# Patient Record
Sex: Female | Born: 1965 | Hispanic: No | Marital: Single | Smoking: Never smoker
Health system: Southern US, Community
[De-identification: ages and names within clinical notes are randomized; demographics above are authoritative.]

## PROBLEM LIST (undated history)

## (undated) DIAGNOSIS — D509 Iron deficiency anemia, unspecified: Secondary | ICD-10-CM

## (undated) DIAGNOSIS — M797 Fibromyalgia: Secondary | ICD-10-CM

## (undated) DIAGNOSIS — R51 Headache: Secondary | ICD-10-CM

## (undated) DIAGNOSIS — F329 Major depressive disorder, single episode, unspecified: Secondary | ICD-10-CM

## (undated) DIAGNOSIS — J45909 Unspecified asthma, uncomplicated: Secondary | ICD-10-CM

## (undated) DIAGNOSIS — F32A Depression, unspecified: Secondary | ICD-10-CM

## (undated) DIAGNOSIS — I341 Nonrheumatic mitral (valve) prolapse: Secondary | ICD-10-CM

## (undated) DIAGNOSIS — M5416 Radiculopathy, lumbar region: Secondary | ICD-10-CM

## (undated) DIAGNOSIS — F419 Anxiety disorder, unspecified: Secondary | ICD-10-CM

## (undated) DIAGNOSIS — L209 Atopic dermatitis, unspecified: Secondary | ICD-10-CM

## (undated) HISTORY — DX: Atopic dermatitis, unspecified: L20.9

## (undated) HISTORY — DX: Major depressive disorder, single episode, unspecified: F32.9

## (undated) HISTORY — DX: Unspecified asthma, uncomplicated: J45.909

## (undated) HISTORY — DX: Iron deficiency anemia, unspecified: D50.9

## (undated) HISTORY — DX: Fibromyalgia: M79.7

## (undated) HISTORY — DX: Anxiety disorder, unspecified: F41.9

## (undated) HISTORY — DX: Nonrheumatic mitral (valve) prolapse: I34.1

## (undated) HISTORY — DX: Depression, unspecified: F32.A

## (undated) HISTORY — DX: Headache: R51

---

## 2012-02-29 ENCOUNTER — Encounter: Payer: Self-pay | Admitting: Internal Medicine

## 2012-03-01 ENCOUNTER — Encounter: Payer: Self-pay | Admitting: Pulmonary Disease

## 2012-03-01 ENCOUNTER — Encounter: Payer: Self-pay | Admitting: Internal Medicine

## 2012-03-01 ENCOUNTER — Ambulatory Visit (INDEPENDENT_AMBULATORY_CARE_PROVIDER_SITE_OTHER): Payer: Medicaid Other | Admitting: Internal Medicine

## 2012-03-01 DIAGNOSIS — J45909 Unspecified asthma, uncomplicated: Secondary | ICD-10-CM | POA: Insufficient documentation

## 2012-03-01 MED ORDER — OMEPRAZOLE 40 MG PO CPDR: 40.0000 mg | DELAYED_RELEASE_CAPSULE | Freq: Every day | ORAL | Status: AC

## 2012-03-01 MED ORDER — FAMOTIDINE 20 MG PO TABS: ORAL_TABLET | ORAL | Status: AC

## 2012-03-01 NOTE — Assessment & Plan Note (Addendum)
-   hfa 75% 03/01/2012    - Spirometry nl x for exp truncation 03/01/2012    - Sinus CT ordered 03/01/2012 >>>  Symptoms are markedly disproportionate to objective findings and not clear this is a lung problem but pt does appear to have difficult airway management issues. DDX of  difficult airways managment all start with A and  include Adherence, Ace Inhibitors, Acid Reflux, Active Sinus Disease, Alpha 1 Antitripsin deficiency, Anxiety masquerading as Airways dz,  ABPA,  allergy(esp in young), Aspiration (esp in elderly), Adverse effects of DPI,  Active smokers, plus two Bs  = Bronchiectasis and Beta blocker use..and one C= CHF  Adherence is always the initial "prime suspect" and is a multilayered concern that requires a "trust but verify" approach in every patient - starting with knowing how to use medications, especially inhalers, correctly, keeping up with refills and understanding the fundamental difference between maintenance and prns vs those medications only taken for a very short course and then stopped and not refilled. The proper method of use, as well as anticipated side effects, of a metered-dose inhaler are discussed and demonstrated to the patient. Improved effectiveness after extensive coaching during this visit to a level of approximately  75%  ? Adverse effect of DPI > try off spiriva as this is not copd  ? Acid reflux > rx  ? Active sinus dz > check sinus ct  Spirometry strongly points to  Classic Upper airway cough syndrome, so named because it's frequently impossible to sort out how much is  CR/sinusitis with freq throat clearing (which can be related to primary GERD)   vs  causing  secondary (" extra esophageal")  GERD from wide swings in gastric pressure that occur with throat clearing, often  promoting self use of mint and menthol lozenges that reduce the lower esophageal sphincter tone and exacerbate the problem further in a cyclical fashion.   These are the same pts (now being  labeled as having "irritable larynx syndrome" by some cough centers) who not infrequently have a history of having failed to tolerate ace inhibitors,  dry powder inhalers or biphosphonates or report having atypical reflux symptoms that don't respond to standard doses of PPI , and are easily confused as having aecopd or asthma flares by even experienced allergists/ pulmonologists.   For now explore sinus dz/ rx reflux then return in 4 weeks for formal pft's  See instructions for specific recommendations which were reviewed directly with the patient who was given a copy with highlighter outlining the key components.

## 2012-03-01 NOTE — Progress Notes (Signed)
Subjective:     Patient ID: Mary Shelton, female   DOB: 1966/09/17, 46 y.o.   MRN: [id number]  HPI  89 yobf never smoker never trouble until 2009 when exposed to sewer fumes eval in ER [place] then eval [facility] now followed in [place] [facility] with refractory "bronchitis"  03/01/2012 1st pulmonary eval / Mary Shelton cc 24 h per day persistent cough assoc with sensation of too much throat mucus with variable production of thick grey mucus and sob with minimal acitivity, not better with saba, spiriva or qvar, assoc with intermittent hb but no overt sinus complaints. No exposure since original event.    Also denies any obvious fluctuation of symptoms with weather or environmental changes or other aggravating or alleviating factors except as outlined above.  ROS  At present neg for  any significant sore throat, dysphagia, dental problems, itching, sneezing,  nasal congestion or excess/ purulent secretions, ear ache,   fever, chills, sweats, unintended wt loss, pleuritic or exertional cp, hemoptysis, palpitations, orthopnea pnd or leg swelling.  Also denies presyncope, palpitations, heartburn, abdominal pain, anorexia, nausea, vomiting, diarrhea  or change in bowel or urinary habits, change in stools or urine, dysuria,hematuria,  rash, arthralgias, visual complaints, headache, numbness weakness or ataxia or problems with walking or coordination. No noted change in mood/affect or memory.                    Review of Systems     Objective:   Physical Exam amb bf   failed to answer a single question asked in a straightforward manner, tending to go off on tangents or answer questions with ambiguous medical terms or diagnoses and seemed perplexed  when asked the same question more than once for clarification entirely focused on the original event   HEENT: nl dentition, turbinates, and orophanx. Nl external ear canals without cough reflex   NECK :  without JVD/Nodes/TM/ nl carotid upstrokes  bilaterally   LUNGS: no acc muscle use, clear to A and P bilaterally without cough on insp or exp maneuvers   CV:  RRR  no s3 or murmur or increase in P2, no edema   ABD:  soft and nontender with nl excursion in the supine position. No bruits or organomegaly, bowel sounds nl  MS:  warm without deformities, calf tenderness, cyanosis or clubbing  SKIN: warm and dry without lesions    NEURO:  alert, approp, no deficits       Assessment:         Plan:

## 2012-03-01 NOTE — Patient Instructions (Addendum)
Stop spiriva to see what difference it makes   Qvar 80 Take 2 puffs first thing in am and then another 2 puffs about 12 hours later  Pepcid 20 mg one at bedtime  Mucinex dm 2 every 12 hours as needed for cough  GERD (REFLUX)  is an extremely common cause of respiratory symptoms, many times with no significant heartburn at all.    It can be treated with medication, but also with lifestyle changes including avoidance of late meals, excessive alcohol, smoking cessation, and avoid fatty foods, chocolate, peppermint, colas, red wine, and acidic juices such as orange juice.  NO MINT OR MENTHOL PRODUCTS SO NO COUGH DROPS  USE SUGARLESS CANDY INSTEAD (jolley ranchers or Stover's)  NO OIL BASED VITAMINS - use powdered substitutes.  Please schedule a follow up office visit in 4 weeks, sooner if needed with PFt's

## 2012-03-03 ENCOUNTER — Other Ambulatory Visit: Payer: Medicaid Other

## 2012-03-07 ENCOUNTER — Encounter: Payer: Self-pay | Admitting: Internal Medicine

## 2012-04-07 ENCOUNTER — Telehealth: Payer: Self-pay | Admitting: Internal Medicine

## 2012-04-07 NOTE — Telephone Encounter (Signed)
Phone call to pt, pt will not be attending appt on Monday April 10, 2012.  Mary Shelton, C.MA

## 2012-04-10 ENCOUNTER — Ambulatory Visit: Payer: Medicaid Other | Admitting: Internal Medicine

## 2017-10-20 ENCOUNTER — Encounter: Payer: Self-pay | Admitting: Physical Medicine & Rehabilitation

## 2017-10-30 ENCOUNTER — Encounter (HOSPITAL_COMMUNITY): Payer: Self-pay | Admitting: *Deleted

## 2017-10-30 ENCOUNTER — Other Ambulatory Visit: Payer: Self-pay

## 2017-10-30 ENCOUNTER — Emergency Department (HOSPITAL_COMMUNITY)
Admission: EM | Admit: 2017-10-30 | Discharge: 2017-10-30 | Disposition: A | Discharge status: Home / Self Care | Payer: Medicaid Other | Attending: Emergency Medicine | Admitting: Emergency Medicine

## 2017-10-30 DIAGNOSIS — M545 Low back pain, unspecified: Secondary | ICD-10-CM

## 2017-10-30 DIAGNOSIS — Z79899 Other long term (current) drug therapy: Secondary | ICD-10-CM | POA: Insufficient documentation

## 2017-10-30 DIAGNOSIS — Z882 Allergy status to sulfonamides status: Secondary | ICD-10-CM | POA: Insufficient documentation

## 2017-10-30 DIAGNOSIS — J45909 Unspecified asthma, uncomplicated: Secondary | ICD-10-CM | POA: Diagnosis not present

## 2017-10-30 DIAGNOSIS — G8929 Other chronic pain: Secondary | ICD-10-CM | POA: Insufficient documentation

## 2017-10-30 DIAGNOSIS — R531 Weakness: Secondary | ICD-10-CM | POA: Diagnosis not present
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
## 2017-10-30 NOTE — Discharge Instructions (Signed)
Please read attached information. If you experience any new or worsening signs or symptoms please return to the emergency room for evaluation. Please follow-up with your primary care provider or specialist as discussed.  °

## 2017-10-30 NOTE — ED Triage Notes (Signed)
Pt reports hx of chronic back pain due to bulging disc. occ gets left leg weakness which has recently began again. Is ambulatory at triage. Reports MRI in past.

## 2017-10-30 NOTE — ED Notes (Signed)
Pt states she understaNDS INSTRUC TIONS. HOME STABLE WITH DAUGHTER.

## 2017-11-03 ENCOUNTER — Encounter (HOSPITAL_COMMUNITY): Payer: Self-pay | Admitting: Emergency Medicine

## 2017-11-03 ENCOUNTER — Ambulatory Visit (HOSPITAL_COMMUNITY)
Admission: EM | Admit: 2017-11-03 | Discharge: 2017-11-03 | Disposition: A | Discharge status: Home / Self Care | Payer: Medicaid Other | Attending: Family Medicine | Admitting: Family Medicine

## 2017-11-03 DIAGNOSIS — M545 Low back pain, unspecified: Secondary | ICD-10-CM

## 2017-11-03 DIAGNOSIS — G8929 Other chronic pain: Secondary | ICD-10-CM | POA: Diagnosis not present

## 2017-11-03 MED ORDER — KETOROLAC TROMETHAMINE 60 MG/2ML IM SOLN
INTRAMUSCULAR | Status: AC
  Filled 2017-11-03: qty 2

## 2017-11-03 MED ORDER — TRAMADOL HCL 50 MG PO TABS: 50.0000 mg | ORAL_TABLET | Freq: Four times a day (QID) | ORAL | 0 refills | Status: DC | PRN

## 2017-11-03 MED ORDER — KETOROLAC TROMETHAMINE 60 MG/2ML IM SOLN
60.0000 mg | Freq: Once | INTRAMUSCULAR | Status: AC
  Administered 2017-11-03: 60 mg via INTRAMUSCULAR

## 2017-11-03 NOTE — Discharge Instructions (Signed)
We gave you an injection of Toradol today.   I have sent a prescription for Tramadol for you- this is a weaker opioid and should not cause as severe sedation as hydrocodone/oxycodone, but will still cause some.   Please set up and appointment with community [place] and wellness (info below) to establish a primary provider and get referral to neurosurgery. I have provided the info below for [facility] who does have a spine doctor- reach out and see if they do injections/if you need referal. May also try [facility].

## 2017-11-03 NOTE — ED Triage Notes (Signed)
PT reports back pain and shooting left leg pain. PT reports cough exacerbates back pain. PT has had cough for a few weeks. PT reports left leg pain has been present for several 2 years
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
## 2018-01-03 ENCOUNTER — Ambulatory Visit (HOSPITAL_COMMUNITY)
Admission: EM | Admit: 2018-01-03 | Discharge: 2018-01-03 | Disposition: A | Discharge status: Home / Self Care | Payer: Medicaid Other | Attending: Family Medicine | Admitting: Family Medicine

## 2018-01-03 ENCOUNTER — Encounter (HOSPITAL_COMMUNITY): Payer: Self-pay | Admitting: Emergency Medicine

## 2018-01-03 DIAGNOSIS — J4521 Mild intermittent asthma with (acute) exacerbation: Secondary | ICD-10-CM

## 2018-01-03 DIAGNOSIS — J069 Acute upper respiratory infection, unspecified: Secondary | ICD-10-CM | POA: Diagnosis not present

## 2018-01-03 MED ORDER — ONDANSETRON 4 MG PO TBDP: 4.0000 mg | ORAL_TABLET | Freq: Three times a day (TID) | ORAL | 0 refills | Status: DC | PRN

## 2018-01-03 MED ORDER — ALBUTEROL SULFATE (2.5 MG/3ML) 0.083% IN NEBU: 2.5000 mg | INHALATION_SOLUTION | Freq: Four times a day (QID) | RESPIRATORY_TRACT | 1 refills | Status: DC | PRN

## 2018-01-03 MED ORDER — PSEUDOEPH-BROMPHEN-DM 30-2-10 MG/5ML PO SYRP: 5.0000 mL | ORAL_SOLUTION | Freq: Four times a day (QID) | ORAL | 0 refills | Status: AC | PRN

## 2018-01-03 MED ORDER — PREDNISONE 50 MG PO TABS: 50.0000 mg | ORAL_TABLET | Freq: Every day | ORAL | 0 refills | Status: AC

## 2018-01-03 MED ORDER — ALBUTEROL SULFATE HFA 108 (90 BASE) MCG/ACT IN AERS: 2.0000 | INHALATION_SPRAY | Freq: Four times a day (QID) | RESPIRATORY_TRACT | 1 refills | Status: DC | PRN

## 2018-01-03 NOTE — Discharge Instructions (Addendum)
Please use your inhaler and nebulizers as needed for wheezing and shortness of breath.  I have refilled both of these for you.  Please also begin prednisone daily for the next 5 days.  This should help with your wheezing and asthma exacerbation.  Please continue symptom management for your other symptoms.  If you begin to have fever or worsening symptoms please return.  For congestion please begin daily allergy pill like Zyrtec or Claritin, may also use Flonase nasal spray.  He may get these over-the-counter.  For cough I have sent in a cough syrup that also has a decongestant in it.  You may also try honey mixed in hot tea.

## 2018-01-03 NOTE — ED Provider Notes (Addendum)
[facility]    CSN: [id number] Arrival date & time: 01/03/18  1000     History   Chief Complaint Chief Complaint  Patient presents with  . Asthma    HPI Mary Shelton is a 52 y.o. female history of asthma, Patient is presenting with URI symptoms- congestion, cough, minimal sore throat.  Also noting she was having high fevers up to 103 this weekend as well as some nausea.  Patient's main complaints are concerned for asthma exacerbation, patient has been wheezing more and is out of albuterol inhaler.  She also uses nebulizers at home.. Symptoms have been going on for 3-4 days. Patient has tried over-the-counter cough medicine, with minimal relief. Denies vomiting, diarrhea. Denies chest pain.    HPI  Past Medical History:  Diagnosis Date  . Anxiety   . Asthma   . Atopic dermatitis   . Depression   . Fibromyalgia   . Headache(784.0)   . Iron deficiency anemia   . Mitral valve prolapse   . Reactive airway disease with wheezing     Patient Active Problem List   Diagnosis Date Noted  . Asthma 03/01/2012    History reviewed. No pertinent surgical history.  OB History   None      Home Medications    Prior to Admission medications   Medication Sig Start Date End Date Taking? Authorizing Provider  albuterol (PROVENTIL HFA;VENTOLIN HFA) 108 (90 Base) MCG/ACT inhaler Inhale 2 puffs into the lungs every 6 (six) hours as needed. 01/03/18   Twilla Khouri C, PA-C  albuterol (PROVENTIL) (2.5 MG/3ML) 0.083% nebulizer solution Take 3 mLs (2.5 mg total) by nebulization every 6 (six) hours as needed for wheezing or shortness of breath. 01/03/18   Caidence Kaseman C, PA-C  beclomethasone (QVAR) 80 MCG/ACT inhaler Inhale 2 puffs into the lungs 2 (two) times daily.     [provider]  brompheniramine-pseudoephedrine-DM 30-2-10 MG/5ML syrup Take 5 mLs by mouth 4 (four) times daily as needed. 01/03/18   Verneda Hollopeter C, PA-C  famotidine (PEPCID) 20 MG tablet One at  bedtime 03/01/12 03/01/13  Nyoka Cowden, MD  omeprazole (PRILOSEC) 40 MG capsule Take 1 capsule (40 mg total) by mouth daily. 03/01/12 03/01/13  Nyoka Cowden, MD  predniSONE (DELTASONE) 50 MG tablet Take 1 tablet (50 mg total) by mouth daily for 5 days. 01/03/18 01/08/18  Leathie Weich C, PA-C  traMADol (ULTRAM) 50 MG tablet Take 1 tablet (50 mg total) by mouth every 6 (six) hours as needed for severe pain. 11/03/17   Clarine Elrod C, PA-C  triamcinolone lotion (KENALOG) 0.1 % Apply 1 application topically 3 (three) times daily.    [provider]    Family History History reviewed. No pertinent family history.  Social History Social History   Tobacco Use  . Smoking status: Never Smoker  . Smokeless tobacco: Never Used  Substance Use Topics  . Alcohol use: No  . Drug use: No     Allergies   Other and Sulfa antibiotics   Review of Systems Review of Systems  Constitutional: Positive for fever. Negative for chills and fatigue.  HENT: Positive for congestion and rhinorrhea. Negative for ear pain, sinus pressure, sore throat and trouble swallowing.   Respiratory: Positive for cough, shortness of breath and wheezing. Negative for chest tightness.   Cardiovascular: Negative for chest pain.  Gastrointestinal: Positive for nausea. Negative for abdominal pain and vomiting.  Musculoskeletal: Negative for myalgias.  Skin: Negative for rash.  Neurological:  Negative for dizziness, light-headedness and headaches.     Physical Exam Triage Vital Signs ED Triage Vitals [01/03/18 1034]  Enc Vitals Group     BP 111/81     Pulse Rate (!) 102     Resp 18     Temp 98.8 F (37.1 C)     Temp Source Oral     SpO2 99 %     Weight      Height      Head Circumference      Peak Flow      Pain Score      Pain Loc      Pain Edu?      Excl. in GC?    No data found.  Updated Vital Signs BP 111/81 (BP Location: Right Arm)   Pulse (!) 102   Temp 98.8 F (37.1 C) (Oral)    Resp 18   SpO2 99%   Visual Acuity Right Eye Distance:   Left Eye Distance:   Bilateral Distance:    Right Eye Near:   Left Eye Near:    Bilateral Near:     Physical Exam  Constitutional: She appears well-developed and well-nourished. No distress.  HENT:  Head: Normocephalic and atraumatic.  Bilateral TMs minimally visualized due to cerumen.  Nasal mucosa mildly erythematous with clear rhinorrhea present.  Posterior oropharynx nonerythematous, no tonsillar enlargement or exudate.  Eyes: Conjunctivae are normal.  Neck: Neck supple.  No cervical lymphadenopathy  Cardiovascular: Normal rate and regular rhythm.  No murmur heard. Pulmonary/Chest: Effort normal and breath sounds normal. No respiratory distress.  Breathing comfortably, diffuse wheezing and rhonchi throughout bilateral lung fields  Abdominal: Soft. There is no tenderness.  Musculoskeletal: She exhibits no edema.  Neurological: She is alert.  Skin: Skin is warm and dry.  Psychiatric: She has a normal mood and affect.  Nursing note and vitals reviewed.    UC Treatments / Results  Labs (all labs ordered are listed, but only abnormal results are displayed) Labs Reviewed - No data to display  EKG None Radiology No results found.  Procedures Procedures (including critical care time)  Medications Ordered in UC Medications - No data to display   Initial Impression / Assessment and Plan / UC Course  I have reviewed the triage vital signs and the nursing notes.  Pertinent labs & imaging results that were available during my care of the patient were reviewed by me and considered in my medical decision making (see chart for details).     Patient likely with viral URI and asthma exacerbation.  Patient having high fevers a few days ago, possible flu.  Vital signs stable at this time.  Will refill albuterol inhaler nebulizers to use as needed for shortness of breath and wheezing.  Prednisone 50 mg x 5 days.  Offered  breathing treatment here in clinic today, patient declined.  Cough syrup provided.  Advised over-the-counter measures to control congestion. Discussed strict return precautions. Patient verbalized understanding and is agreeable with plan.   Final Clinical Impressions(s) / UC Diagnoses   Final diagnoses:  Mild intermittent asthma with exacerbation  Upper respiratory tract infection, unspecified type    ED Discharge Orders        Ordered    albuterol (PROVENTIL HFA;VENTOLIN HFA) 108 (90 Base) MCG/ACT inhaler  Every 6 hours PRN     01/03/18 1050    albuterol (PROVENTIL) (2.5 MG/3ML) 0.083% nebulizer solution  Every 6 hours PRN     01/03/18 1050  predniSONE (DELTASONE) 50 MG tablet  Daily     01/03/18 1050    brompheniramine-pseudoephedrine-DM 30-2-10 MG/5ML syrup  4 times daily PRN     01/03/18 1050       Controlled Substance Prescriptions [place] Controlled Substance Registry consulted? Not Applicable   Lew Dawes, PA-C 01/03/18 1110    Syrena Burges, [place], [place] 01/03/18 1110

## 2018-01-03 NOTE — ED Triage Notes (Signed)
Pt sts URI sx and out of asthma meds

## 2018-01-30 ENCOUNTER — Ambulatory Visit (HOSPITAL_COMMUNITY)
Admission: EM | Admit: 2018-01-30 | Discharge: 2018-01-30 | Disposition: A | Discharge status: Home / Self Care | Payer: Medicaid Other | Attending: Family Medicine | Admitting: Family Medicine

## 2018-01-30 ENCOUNTER — Other Ambulatory Visit: Payer: Self-pay

## 2018-01-30 ENCOUNTER — Encounter (HOSPITAL_COMMUNITY): Payer: Self-pay

## 2018-01-30 DIAGNOSIS — F410 Panic disorder [episodic paroxysmal anxiety] without agoraphobia: Secondary | ICD-10-CM

## 2018-01-30 DIAGNOSIS — F4310 Post-traumatic stress disorder, unspecified: Secondary | ICD-10-CM | POA: Diagnosis not present

## 2018-01-30 DIAGNOSIS — F430 Acute stress reaction: Secondary | ICD-10-CM | POA: Diagnosis not present

## 2018-01-30 DIAGNOSIS — F411 Generalized anxiety disorder: Secondary | ICD-10-CM

## 2018-01-30 MED ORDER — HYDROXYZINE HCL 25 MG PO TABS: 25.0000 mg | ORAL_TABLET | Freq: Four times a day (QID) | ORAL | 0 refills | Status: AC | PRN

## 2018-01-30 NOTE — ED Triage Notes (Signed)
Pt presents with complaints of having an anxiety attack today and feeling like her whole body is vibrating.

## 2018-01-30 NOTE — ED Provider Notes (Signed)
[facility]    CSN: [id number] Arrival date & time: 01/30/18  1217     History   Chief Complaint Chief Complaint  Patient presents with  . Panic Attack    HPI Mary Shelton is a 52 y.o. female.   Patient complains of panic attack.  It began today after she had a confrontation with her real-estate agent regarding moving into a townhome.  Patient states she has a stressful home life and PTSD. She describes it whole body "buzzing."  She has not tried anything without relief.  She denies aggravating symptoms.  She reports similar symptoms in the past that improved with clonidine and wellbutrin with relief.       Past Medical History:  Diagnosis Date  . Anxiety   . Asthma   . Atopic dermatitis   . Depression   . Fibromyalgia   . Headache(784.0)   . Iron deficiency anemia   . Mitral valve prolapse   . Reactive airway disease with wheezing     Patient Active Problem List   Diagnosis Date Noted  . Asthma 03/01/2012    History reviewed. No pertinent surgical history.  OB History   None      Home Medications    Prior to Admission medications   Medication Sig Start Date End Date Taking? Authorizing Provider  albuterol (PROVENTIL HFA;VENTOLIN HFA) 108 (90 Base) MCG/ACT inhaler Inhale 2 puffs into the lungs every 6 (six) hours as needed. 01/03/18   Wieters, Hallie C, PA-C  albuterol (PROVENTIL) (2.5 MG/3ML) 0.083% nebulizer solution Take 3 mLs (2.5 mg total) by nebulization every 6 (six) hours as needed for wheezing or shortness of breath. 01/03/18   Wieters, Hallie C, PA-C  beclomethasone (QVAR) 80 MCG/ACT inhaler Inhale 2 puffs into the lungs 2 (two) times daily.     [provider]  brompheniramine-pseudoephedrine-DM 30-2-10 MG/5ML syrup Take 5 mLs by mouth 4 (four) times daily as needed. 01/03/18   Wieters, Hallie C, PA-C  famotidine (PEPCID) 20 MG tablet One at bedtime 03/01/12 03/01/13  Nyoka CowdenWert, Michael B, MD  hydrOXYzine (ATARAX/VISTARIL) 25 MG tablet  Take 1 tablet (25 mg total) by mouth every 6 (six) hours as needed for up to 5 days for anxiety. 01/30/18 02/04/18  Antwoine Zorn, Lowanda FosterBrittany, PA-C  omeprazole (PRILOSEC) 40 MG capsule Take 1 capsule (40 mg total) by mouth daily. 03/01/12 03/01/13  Nyoka CowdenWert, Michael B, MD  ondansetron (ZOFRAN ODT) 4 MG disintegrating tablet Take 1 tablet (4 mg total) by mouth every 8 (eight) hours as needed for nausea or vomiting. 01/03/18   Wieters, Hallie C, PA-C  traMADol (ULTRAM) 50 MG tablet Take 1 tablet (50 mg total) by mouth every 6 (six) hours as needed for severe pain. 11/03/17   Wieters, Hallie C, PA-C  triamcinolone lotion (KENALOG) 0.1 % Apply 1 application topically 3 (three) times daily.    [provider]    Family History History reviewed. No pertinent family history.  Social History Social History   Tobacco Use  . Smoking status: Never Smoker  . Smokeless tobacco: Never Used  Substance Use Topics  . Alcohol use: No  . Drug use: No     Allergies   Other and Sulfa antibiotics   Review of Systems Review of Systems  Constitutional: Negative for chills and fever.  Respiratory: Negative for shortness of breath.   Cardiovascular: Negative for chest pain.  Gastrointestinal: Negative for abdominal pain, constipation, diarrhea, nausea and vomiting.  Genitourinary: Negative for dysuria.  Psychiatric/Behavioral: The patient  is nervous/anxious.      Physical Exam Triage Vital Signs ED Triage Vitals  Enc Vitals Group     BP 01/30/18 1317 (!) 154/96     Pulse Rate 01/30/18 1317 98     Resp 01/30/18 1317 18     Temp 01/30/18 1317 98.1 F (36.7 C)     Temp src --      SpO2 01/30/18 1317 100 %     Weight 01/30/18 1318 145 lb (65.8 kg)     Height --      Head Circumference --      Peak Flow --      Pain Score 01/30/18 1317 7     Pain Loc --      Pain Edu? --      Excl. in GC? --    No data found.  Updated Vital Signs BP (!) 154/96   Pulse 98   Temp 98.1 F (36.7 C)   Resp 18   Wt  145 lb (65.8 kg)   SpO2 100%   BMI 24.13 kg/m      Physical Exam  Constitutional: She is oriented to person, place, and time. She appears well-developed and well-nourished. No distress.  HENT:  Head: Normocephalic and atraumatic.  Right Ear: External ear normal.  Left Ear: External ear normal.  Nose: Nose normal.  Mouth/Throat: Oropharynx is clear and moist. No oropharyngeal exudate.  Eyes: Pupils are equal, round, and reactive to light. Conjunctivae and EOM are normal.  Neck: Normal range of motion. Neck supple.  Cardiovascular: Normal rate, regular rhythm and normal heart sounds. Exam reveals no gallop and no friction rub.  No murmur heard. Pulmonary/Chest: Effort normal and breath sounds normal. No respiratory distress. She has no wheezes. She has no rales.  Abdominal: Soft. Bowel sounds are normal. There is no tenderness. There is no guarding.  Lymphadenopathy:    She has no cervical adenopathy.  Neurological: She is alert and oriented to person, place, and time.  Skin: Skin is warm and dry. Capillary refill takes 2 to 3 seconds. She is not diaphoretic.  Psychiatric:  Sitting slightly slouched over on exam table without obvious signs of fidgeting or tremor.  Appearance .Hygiene: clean without noticeable body odor .Dress: Dressed for warm weather with sweater and stocking hat although it is spring time .Jewelry: No unusual rings or jewelry  .Makeup: None .Other: tattoo left forearm  Speech .General: Clear without accent .Rate: fast  .Latency WNL .Volume: Normal .Intonations: Normal  Behavior .General: increased activity mild restlessness.  Shifting on exam table .Eye Contact: Normal .Slight slouched over on exam table, but pt admits to chronic back pain  Cooperativeness .Cooperative  Thought Processes .Logical, but moves fast between ideas  Thought Content .Future oriented.  States she needs to follow up with PCP for further evaluation and treatment of  anxiety, stress, and PTSD  Mood Stressed and as if whole body is "buzzing."  Affect .Anxious and irritable.  Appropriate given circumstance of current situation.   .Type: Anxious .Appropriateness to content and congruence with stated mood  Cognition  Insight/Judgment .Good.  Patient is aware or her mental health and states she needs to follow up with PCP for further evaluation and treatment   Nursing note and vitals reviewed.    UC Treatments / Results  Labs (all labs ordered are listed, but only abnormal results are displayed) Labs Reviewed - No data to display  EKG None Radiology No results found.  Procedures Procedures (including critical  care time)  Medications Ordered in UC Medications - No data to display   Initial Impression / Assessment and Plan / UC Course  I have reviewed the triage vital signs and the nursing notes.  Pertinent labs & imaging results that were available during my care of the patient were reviewed by me and considered in my medical decision making (see chart for details).    Patient presents with symptoms of panic attack.  Symptoms and history consistent with panic attack.  Hydroxyzine prescribed.  Patient instructed to follow up with PCP this week for further evaluation and treatment.  Return and ER precautions given.   Final Clinical Impressions(s) / UC Diagnoses   Final diagnoses:  Panic attack as reaction to stress  Generalized anxiety disorder  PTSD (post-traumatic stress disorder)    ED Discharge Orders        Ordered    hydrOXYzine (ATARAX/VISTARIL) 25 MG tablet  Every 6 hours PRN     01/30/18 1359       Controlled Substance Prescriptions [place] Controlled Substance Registry consulted? No   Rennis Harding, [place] 01/30/18 1420

## 2018-01-30 NOTE — Discharge Instructions (Addendum)
Take medication as prescribed Follow up with PCP on Wednesday for further evaluation and treatment Return here or go to ER if you have any new or worsening symptoms

## 2018-02-03 ENCOUNTER — Telehealth (HOSPITAL_COMMUNITY): Payer: Self-pay | Admitting: Emergency Medicine

## 2018-02-04 NOTE — Telephone Encounter (Signed)
Entered in error

## 2018-05-23 ENCOUNTER — Other Ambulatory Visit: Payer: Self-pay | Admitting: Anesthesiology

## 2018-05-23 DIAGNOSIS — M5417 Radiculopathy, lumbosacral region: Secondary | ICD-10-CM

## 2018-05-23 DIAGNOSIS — G894 Chronic pain syndrome: Secondary | ICD-10-CM

## 2018-05-28 ENCOUNTER — Inpatient Hospital Stay
Admission: RE | Admit: 2018-05-28 | Discharge: 2018-05-28 | Disposition: A | Discharge status: Home / Self Care | Payer: Medicaid Other | Source: Ambulatory Visit | Attending: Anesthesiology | Admitting: Anesthesiology

## 2018-06-01 ENCOUNTER — Other Ambulatory Visit (HOSPITAL_COMMUNITY): Payer: Self-pay | Admitting: Anesthesiology

## 2018-06-01 ENCOUNTER — Other Ambulatory Visit: Payer: Self-pay | Admitting: Anesthesiology

## 2018-06-01 DIAGNOSIS — G894 Chronic pain syndrome: Secondary | ICD-10-CM

## 2018-06-01 DIAGNOSIS — M5417 Radiculopathy, lumbosacral region: Secondary | ICD-10-CM

## 2018-06-03 ENCOUNTER — Ambulatory Visit (HOSPITAL_COMMUNITY)
Admission: RE | Admit: 2018-06-03 | Discharge: 2018-06-03 | Disposition: A | Discharge status: Home / Self Care | Payer: Medicaid Other | Source: Ambulatory Visit | Attending: Anesthesiology | Admitting: Anesthesiology

## 2018-06-03 DIAGNOSIS — M5417 Radiculopathy, lumbosacral region: Secondary | ICD-10-CM | POA: Diagnosis present

## 2018-06-03 DIAGNOSIS — G894 Chronic pain syndrome: Secondary | ICD-10-CM | POA: Diagnosis not present

## 2018-06-03 DIAGNOSIS — M47814 Spondylosis without myelopathy or radiculopathy, thoracic region: Secondary | ICD-10-CM | POA: Diagnosis not present

## 2018-06-09 ENCOUNTER — Other Ambulatory Visit: Payer: Medicaid Other

## 2018-10-11 HISTORY — PX: SPINAL CORD STIMULATOR IMPLANT: SHX2422

## 2018-11-29 ENCOUNTER — Encounter: Payer: Self-pay | Admitting: Physical Therapy

## 2018-11-29 ENCOUNTER — Other Ambulatory Visit: Payer: Self-pay

## 2018-11-29 ENCOUNTER — Ambulatory Visit: Payer: Medicaid Other | Attending: Anesthesiology | Admitting: Physical Therapy

## 2018-11-29 DIAGNOSIS — G8929 Other chronic pain: Secondary | ICD-10-CM | POA: Diagnosis present

## 2018-11-29 DIAGNOSIS — R2689 Other abnormalities of gait and mobility: Secondary | ICD-10-CM | POA: Diagnosis present

## 2018-11-29 DIAGNOSIS — R262 Difficulty in walking, not elsewhere classified: Secondary | ICD-10-CM | POA: Insufficient documentation

## 2018-11-29 DIAGNOSIS — M6281 Muscle weakness (generalized): Secondary | ICD-10-CM | POA: Insufficient documentation

## 2018-11-29 DIAGNOSIS — M5442 Lumbago with sciatica, left side: Secondary | ICD-10-CM | POA: Diagnosis present

## 2018-11-29 NOTE — Patient Instructions (Signed)
Knee to Chest: Advanced --  WITH PELVIC TILT    Lie with one leg straight, one bent. Bring bent knee up. Be sure pelvis does not tilt or rotate. Lift knee _10__ times. Restabilize pelvis. Repeat with other leg. Do _1__ sets, __2_ times per day.  [email or web address].[email or web address]   Hip Extension: 2-4 Inches    Tighten gluteal muscle. Lift one leg _10__ times. Restabilize pelvis. Repeat with other leg. Keep pelvis still. Be sure pelvis does not rotate and back does not arch. Do _1__ sets, _10__ times per day.  CAN DO in STANDING position if prone gets too hard - may place pillow under hips if you feel it in your low back  http://ss.exer.us/63   Copyright  VHI. All rights reserved.  HIP: Abduction - Side-Lying    Lie on side, legs straight and in line with trunk. Squeeze glutes. Raise top leg up and slightly back. Point toes forward. _10__ reps per set, __1_ sets per day, __5_ days per week Bend bottom leg to stabilize pelvis.  Copyright  VHI. All rights reserved.  Straight Leg Raise    Tighten stomach and slowly raise locked right leg _8-10___ inches from floor. Repeat _10__ times per set. Do __1__ sets per session. Do _1___ sessions per day.  http://orth.exer.us/1103   Copyright  VHI. All rights reserved.   Pelvic Tilt    Flatten back by tightening stomach muscles and buttocks. Repeat __10__ times per set. Do __1-2__ sets per session. Do _1___ sessions per day.  HOLD 5 secs  http://orth.exer.[email or web address]   Copyright  VHI. All rights reserved.
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
## 2018-12-01 ENCOUNTER — Encounter (HOSPITAL_COMMUNITY): Payer: Self-pay

## 2018-12-01 ENCOUNTER — Emergency Department (HOSPITAL_COMMUNITY)
Admission: EM | Admit: 2018-12-01 | Discharge: 2018-12-01 | Disposition: A | Discharge status: Home / Self Care | Payer: Medicaid Other | Attending: Emergency Medicine | Admitting: Emergency Medicine

## 2018-12-01 ENCOUNTER — Other Ambulatory Visit: Payer: Self-pay

## 2018-12-01 DIAGNOSIS — J45909 Unspecified asthma, uncomplicated: Secondary | ICD-10-CM | POA: Insufficient documentation

## 2018-12-01 DIAGNOSIS — Z79899 Other long term (current) drug therapy: Secondary | ICD-10-CM | POA: Diagnosis not present

## 2018-12-01 DIAGNOSIS — M79605 Pain in left leg: Secondary | ICD-10-CM | POA: Diagnosis present

## 2018-12-01 MED ORDER — METHOCARBAMOL 750 MG PO TABS: 750.0000 mg | ORAL_TABLET | Freq: Three times a day (TID) | ORAL | 0 refills | Status: AC | PRN
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
## 2018-12-01 NOTE — Discharge Instructions (Signed)
If you are able to take ibuprofen and tylenol please follow the instructions below.  Please take Ibuprofen (Advil, motrin) and Tylenol (acetaminophen) to relieve your pain.  You may take up to 600 MG (3 pills) of normal strength ibuprofen every 8 hours as needed.  In between doses of ibuprofen you make take tylenol, up to 1,000 mg (two extra strength pills).  Do not take more than 3,000 mg tylenol in a 24 hour period.  Please check all medication labels as many medications such as pain and cold medications may contain tylenol.  Do not drink alcohol while taking these medications.  Do not take other NSAID'S while taking ibuprofen (such as aleve or naproxen).  Please take ibuprofen with food to decrease stomach upset.  The best way to get rid of muscle pain is by taking NSAIDS, using heat, massage therapy, and gentle stretching/range of motion exercises.  You are being prescribed a medication which may make you sleepy. For 24 hours after one dose please do not drive, operate heavy machinery, care for a small child with out another adult present, or perform any activities that may cause harm to you or someone else if you were to fall asleep or be impaired.
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
## 2018-12-01 NOTE — ED Notes (Signed)
Patient verbalizes understanding of discharge instructions. Opportunity for questioning and answers were provided. Armband removed by staff, pt discharged from ED.  

## 2018-12-08 ENCOUNTER — Other Ambulatory Visit: Payer: Self-pay

## 2018-12-08 ENCOUNTER — Encounter (HOSPITAL_COMMUNITY): Payer: Self-pay | Admitting: Emergency Medicine

## 2018-12-08 ENCOUNTER — Ambulatory Visit (HOSPITAL_COMMUNITY)
Admission: EM | Admit: 2018-12-08 | Discharge: 2018-12-08 | Disposition: A | Discharge status: Home / Self Care | Payer: Medicaid Other

## 2018-12-08 ENCOUNTER — Ambulatory Visit (INDEPENDENT_AMBULATORY_CARE_PROVIDER_SITE_OTHER): Payer: Medicaid Other

## 2018-12-08 DIAGNOSIS — M25511 Pain in right shoulder: Secondary | ICD-10-CM

## 2018-12-08 MED ORDER — DICLOFENAC SODIUM 1 % TD GEL: 2.0000 g | Freq: Four times a day (QID) | TRANSDERMAL | 0 refills | Status: AC

## 2018-12-08 NOTE — Discharge Instructions (Addendum)
X-rays did not show fracture or dislocation Declines sling at this time Rest, ice and heat as needed Ensure adequate ROM as tolerated. Injuries all appear to be muscular in nature. Prescribed voltaren gel.  Use as prescribed for pain It may take 3-4 weeks for complete resolution of symptoms Will f/u with her doctor or here if not seeing significant improvement within one week. Return here or go to ER if you have any new or worsening symptoms such as numbness/tingling of the inner thighs, loss of bladder or bowel control, headache/blurry vision, nausea/vomiting, confusion/altered mental status, dizziness, weakness, passing out, imbalance, etc..Marland Kitchen
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
## 2018-12-13 ENCOUNTER — Encounter: Payer: Self-pay | Admitting: Physical Therapy

## 2018-12-13 ENCOUNTER — Ambulatory Visit: Payer: Medicaid Other | Attending: Anesthesiology | Admitting: Physical Therapy

## 2018-12-13 DIAGNOSIS — R2689 Other abnormalities of gait and mobility: Secondary | ICD-10-CM | POA: Diagnosis present

## 2018-12-13 DIAGNOSIS — M6281 Muscle weakness (generalized): Secondary | ICD-10-CM | POA: Diagnosis present

## 2018-12-13 DIAGNOSIS — M5442 Lumbago with sciatica, left side: Secondary | ICD-10-CM | POA: Insufficient documentation

## 2018-12-13 DIAGNOSIS — G8929 Other chronic pain: Secondary | ICD-10-CM | POA: Diagnosis present

## 2018-12-13 DIAGNOSIS — R262 Difficulty in walking, not elsewhere classified: Secondary | ICD-10-CM | POA: Insufficient documentation
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
## 2018-12-13 NOTE — Patient Instructions (Signed)
Access Code: [email or web address]  URL: [email or web address]  Date: 12/13/2018  Prepared by: Modena Morrow   Exercises Standing Hip Extension with Ankle Weight - 10 reps - 2 sets - 1x daily - 5x weekly Standing Hip Flexion with Ankle Weight - 10 reps - 2 sets - 1x daily - 5x weekly Standing Hip Flexion - 10 reps - 2 sets - 1x daily - 5x weekly Standing Hip Abduction with Ankle Weight - 10 reps - 2 sets - 1x daily - 5x weekly Standing Alternating Knee Flexion with Ankle Weights - 10 reps - 2 sets - 1x daily - 5x weekly Prone Hip Extension with Ankle Weight - 10 reps - 2 sets - 1x daily - 5x weekly

## 2018-12-14 ENCOUNTER — Ambulatory Visit (HOSPITAL_COMMUNITY)
Admission: EM | Admit: 2018-12-14 | Discharge: 2018-12-14 | Disposition: A | Discharge status: Home / Self Care | Payer: Medicaid Other | Attending: Family Medicine | Admitting: Family Medicine

## 2018-12-14 ENCOUNTER — Encounter (HOSPITAL_COMMUNITY): Payer: Self-pay | Admitting: Emergency Medicine

## 2018-12-14 DIAGNOSIS — J4520 Mild intermittent asthma, uncomplicated: Secondary | ICD-10-CM

## 2018-12-14 MED ORDER — BECLOMETHASONE DIPROPIONATE 80 MCG/ACT IN AERS: 2.0000 | INHALATION_SPRAY | Freq: Two times a day (BID) | RESPIRATORY_TRACT | 2 refills | Status: AC

## 2018-12-14 MED ORDER — ALBUTEROL SULFATE HFA 108 (90 BASE) MCG/ACT IN AERS: 2.0000 | INHALATION_SPRAY | Freq: Four times a day (QID) | RESPIRATORY_TRACT | 2 refills | Status: DC | PRN

## 2018-12-14 MED ORDER — ALBUTEROL SULFATE (2.5 MG/3ML) 0.083% IN NEBU: 2.5000 mg | INHALATION_SOLUTION | Freq: Four times a day (QID) | RESPIRATORY_TRACT | 2 refills | Status: DC | PRN

## 2018-12-14 NOTE — ED Provider Notes (Signed)
[place]   [id number] 12/14/18 Arrival Time: 9381  ASSESSMENT & PLAN:  1. Mild intermittent asthma without complication    Currently without exacerbation. Requesting refills of medications. No indication for chest imaging at this time. Discussed.  She voices concern over suspicion of mold in her home; specifically in her air vents. Brings an OTC "mold test kit" in today containing a petri dish with growth present; appears mold-like; reports samples taken from her home. Questions if this is triggering her asthma exacerbations.  Meds ordered this encounter  Medications  . albuterol (PROVENTIL HFA;VENTOLIN HFA) 108 (90 Base) MCG/ACT inhaler    Sig: Inhale 2 puffs into the lungs every 6 (six) hours as needed.    Dispense:  8 g    Refill:  2  . albuterol (PROVENTIL) (2.5 MG/3ML) 0.083% nebulizer solution    Sig: Take 3 mLs (2.5 mg total) by nebulization every 6 (six) hours as needed for wheezing or shortness of breath.    Dispense:  75 mL    Refill:  2  . beclomethasone (QVAR) 80 MCG/ACT inhaler    Sig: Inhale 2 puffs into the lungs 2 (two) times daily.    Dispense:  1 Inhaler    Refill:  2    Reviewed expectations re: course of current medical issues. Questions answered. Outlined signs and symptoms indicating need for more acute intervention. Patient verbalized understanding. After Visit Summary given.  SUBJECTIVE: History from: patient.  Mary Shelton is a 53 y.o. female who presents with complaint of intermittent chest tightness and wheezing. Triggers: see A&P above. Asthma exacerbations at least once a week. Usually controlled with her asthma medications. No recent illnesses. Describes wheezing as mild to moderate when present. Fever: no. Overall normal PO intake without n/v. Sick contacts: no. Typically her asthma is fairly well controlled.  Social History   Tobacco Use  Smoking Status Never Smoker  Smokeless Tobacco Never Used    ROS: As per HPI. All  other systems negative.   OBJECTIVE:  Vitals:   12/14/18 1144  BP: 136/78  Pulse: 72  Resp: 18  Temp: 97.8 F (36.6 C)  TempSrc: Temporal  SpO2: 100%     General appearance: alert; no distress HEENT: nasal congestion; clear runny nose; throat irritation secondary to post-nasal drainage Neck: supple without LAD Cv: RRR without murmer Lungs: unlabored respirations, mild bilateral expiratory wheezing; cough: absent; no significant respiratory distress Skin: warm and dry Psychological: alert and cooperative; normal mood and affect   Allergies  Allergen Reactions  . Other     " can't take any narcotics." becomes lethargic per PT  . Sulfa Antibiotics     Past Medical History:  Diagnosis Date  . Anxiety   . Asthma   . Atopic dermatitis   . Depression   . Fibromyalgia   . Headache(784.0)   . Iron deficiency anemia   . Mitral valve prolapse   . Reactive airway disease with wheezing    Family History  Problem Relation Age of Onset  . Healthy Mother   . Cancer Father    Social History   Socioeconomic History  . Marital status: Single    Spouse name: Not on file  . Number of children: 3  . Years of education: Not on file  . Highest education level: Not on file  Occupational History  . Occupation: unemployed  Social Needs  . Financial resource strain: Not on file  . Food insecurity:    Worry: Not on  file    Inability: Not on file  . Transportation needs:    Medical: Not on file    Non-medical: Not on file  Tobacco Use  . Smoking status: Never Smoker  . Smokeless tobacco: Never Used  Substance and Sexual Activity  . Alcohol use: No  . Drug use: No  . Sexual activity: Not on file  Lifestyle  . Physical activity:    Days per week: Not on file    Minutes per session: Not on file  . Stress: Not on file  Relationships  . Social connections:    Talks on phone: Not on file    Gets together: Not on file    Attends religious service: Not on file    Active  member of club or organization: Not on file    Attends meetings of clubs or organizations: Not on file    Relationship status: Not on file  . Intimate partner violence:    Fear of current or ex partner: Not on file    Emotionally abused: Not on file    Physically abused: Not on file    Forced sexual activity: Not on file  Other Topics Concern  . Not on file  Social History Narrative  . Not on file            Vanessa Kick, MD 12/14/18 1357

## 2018-12-14 NOTE — ED Triage Notes (Signed)
Pt here for asthma med refill

## 2018-12-21 ENCOUNTER — Ambulatory Visit: Payer: Medicaid Other | Admitting: Physical Therapy

## 2018-12-21 DIAGNOSIS — M6281 Muscle weakness (generalized): Secondary | ICD-10-CM

## 2018-12-21 DIAGNOSIS — M5442 Lumbago with sciatica, left side: Secondary | ICD-10-CM | POA: Diagnosis not present

## 2018-12-21 DIAGNOSIS — R262 Difficulty in walking, not elsewhere classified: Secondary | ICD-10-CM

## 2018-12-22 ENCOUNTER — Encounter: Payer: Self-pay | Admitting: Physical Therapy
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
## 2018-12-27 ENCOUNTER — Ambulatory Visit: Payer: Medicaid Other | Admitting: Physical Therapy

## 2019-01-03 ENCOUNTER — Ambulatory Visit: Payer: Medicaid Other | Admitting: Physical Therapy

## 2019-01-09 ENCOUNTER — Other Ambulatory Visit: Payer: Self-pay | Admitting: Nurse Practitioner

## 2019-01-09 DIAGNOSIS — M545 Low back pain, unspecified: Secondary | ICD-10-CM

## 2019-03-22 ENCOUNTER — Other Ambulatory Visit: Payer: Self-pay

## 2019-03-22 ENCOUNTER — Ambulatory Visit (HOSPITAL_COMMUNITY)
Admission: EM | Admit: 2019-03-22 | Discharge: 2019-03-22 | Disposition: A | Discharge status: Home / Self Care | Payer: Medicaid Other | Attending: Internal Medicine | Admitting: Internal Medicine

## 2019-03-22 ENCOUNTER — Encounter (HOSPITAL_COMMUNITY): Payer: Self-pay | Admitting: Emergency Medicine

## 2019-03-22 DIAGNOSIS — R002 Palpitations: Secondary | ICD-10-CM | POA: Insufficient documentation

## 2019-03-22 LAB — CBC
HCT: 38.7 % (ref 36.0–46.0)
Hemoglobin: 12.4 g/dL (ref 12.0–15.0)
MCH: 25.1 pg — ABNORMAL LOW (ref 26.0–34.0)
MCHC: 32 g/dL (ref 30.0–36.0)
MCV: 78.2 fL — ABNORMAL LOW (ref 80.0–100.0)
Platelets: 295 10*3/uL (ref 150–400)
RBC: 4.95 MIL/uL (ref 3.87–5.11)
RDW: 15.2 % (ref 11.5–15.5)
WBC: 5.9 10*3/uL (ref 4.0–10.5)
nRBC: 0 % (ref 0.0–0.2)

## 2019-03-22 LAB — TSH: TSH: 1.059 u[IU]/mL (ref 0.350–4.500)
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
## 2019-03-22 NOTE — Discharge Instructions (Signed)
No concerning findings on your EKG done in office today. Thyroid level pending - we will call you if this is ABNORMAL. Follow up with your PCP regarding iron level and keep a symptom log until that appointment. Return if you develop chest pain, dizziness, loss of consciousness, or difficulty breathing

## 2019-06-17 ENCOUNTER — Encounter (HOSPITAL_COMMUNITY): Payer: Self-pay | Admitting: *Deleted

## 2019-06-17 ENCOUNTER — Ambulatory Visit (HOSPITAL_COMMUNITY)
Admission: EM | Admit: 2019-06-17 | Discharge: 2019-06-17 | Disposition: A | Discharge status: Home / Self Care | Payer: Medicaid Other | Attending: Family Medicine | Admitting: Family Medicine

## 2019-06-17 ENCOUNTER — Other Ambulatory Visit: Payer: Self-pay

## 2019-06-17 DIAGNOSIS — R21 Rash and other nonspecific skin eruption: Secondary | ICD-10-CM | POA: Diagnosis not present

## 2019-06-17 HISTORY — DX: Radiculopathy, lumbar region: M54.16

## 2019-06-17 MED ORDER — TRIAMCINOLONE ACETONIDE 0.1 % EX CREA
1.0000 | TOPICAL_CREAM | Freq: Two times a day (BID) | CUTANEOUS | 0 refills | Status: AC
Start: 2019-06-17 — End: ?

## 2019-06-17 MED ORDER — PREDNISONE 5 MG PO TABS: ORAL_TABLET | ORAL | 0 refills | Status: DC

## 2019-06-17 NOTE — ED Provider Notes (Signed)
[facility]    CSN: [id number] Arrival date & time: 06/17/19  1003      History   Chief Complaint Chief Complaint  Patient presents with  . Rash    HPI Mary Shelton is a 53 y.o. female.   She is presenting with rash.  Is been ongoing for a couple of days.  The rash is pruritic in nature and is occurring around her neck and upper chest.  She reports having a history of eczema.  She feels her symptoms are worse since she has been hot and sweaty.  She denies any fevers or exposures to new things.  No pet exposure.  No new or different medications.  Has run out of the triamcinolone cream.  HPI  Past Medical History:  Diagnosis Date  . Anxiety   . Asthma   . Atopic dermatitis   . Depression   . Fibromyalgia   . Headache(784.0)   . Iron deficiency anemia   . Lumbar radiculopathy   . Mitral valve prolapse   . Reactive airway disease with wheezing     Patient Active Problem List   Diagnosis Date Noted  . Asthma 03/01/2012    Past Surgical History:  Procedure Laterality Date  . SPINAL CORD STIMULATOR IMPLANT Left 10/2018    OB History   No obstetric history on file.      Home Medications    Prior to Admission medications   Medication Sig Start Date End Date Taking? Authorizing Provider  albuterol (PROVENTIL HFA;VENTOLIN HFA) 108 (90 Base) MCG/ACT inhaler Inhale 2 puffs into the lungs every 6 (six) hours as needed. 12/14/18  Yes Mardella Layman, MD  albuterol (PROVENTIL) (2.5 MG/3ML) 0.083% nebulizer solution Take 3 mLs (2.5 mg total) by nebulization every 6 (six) hours as needed for wheezing or shortness of breath. 12/14/18  Yes Hagler, Arlys John, MD  buPROPion (WELLBUTRIN) 75 MG tablet Take 75 mg by mouth daily. 05/23/18 06/17/19 Yes [provider]  Fluticasone-Salmeterol (ADVAIR DISKUS IN) Inhale into the lungs.   Yes [provider]  traZODone (DESYREL) 50 MG tablet Take 50 mg by mouth at bedtime. 05/23/18  Yes [provider]   beclomethasone (QVAR) 80 MCG/ACT inhaler Inhale 2 puffs into the lungs 2 (two) times daily. 12/14/18   Mardella Layman, MD  brompheniramine-pseudoephedrine-DM 30-2-10 MG/5ML syrup Take 5 mLs by mouth 4 (four) times daily as needed. 01/03/18   Wieters, Hallie C, PA-C  diclofenac sodium (VOLTAREN) 1 % GEL Apply 2 g topically 4 (four) times daily. 12/08/18   Wurst, [place], PA-C  famotidine (PEPCID) 20 MG tablet One at bedtime 03/01/12 03/01/13  Nyoka Cowden, MD  methocarbamol (ROBAXIN) 750 MG tablet Take 1-2 tablets (750-1,500 mg total) by mouth 3 (three) times daily as needed for muscle spasms. 12/01/18   Cristina Gong, PA-C  omeprazole (PRILOSEC) 40 MG capsule Take 1 capsule (40 mg total) by mouth daily. 03/01/12 03/01/13  Nyoka Cowden, MD  ondansetron (ZOFRAN ODT) 4 MG disintegrating tablet Take 1 tablet (4 mg total) by mouth every 8 (eight) hours as needed for nausea or vomiting. 01/03/18   Wieters, Hallie C, PA-C  predniSONE (DELTASONE) 5 MG tablet Take 6 pills for first day, 5 pills second day, 4 pills third day, 3 pills fourth day, 2 pills the fifth day, and 1 pill sixth day. 06/17/19   Myra Rude, MD  triamcinolone cream (KENALOG) 0.1 % Apply 1 application topically 2 (two) times daily. 06/17/19   Myra Rude,  MD    Family History Family History  Problem Relation Age of Onset  . Healthy Mother   . Cancer Father     Social History Social History   Tobacco Use  . Smoking status: Never Smoker  . Smokeless tobacco: Never Used  Substance Use Topics  . Alcohol use: No  . Drug use: No     Allergies   Other and Sulfa antibiotics   Review of Systems Review of Systems  Constitutional: Negative for fever.  HENT: Negative for congestion.   Respiratory: Negative for cough.   Cardiovascular: Negative for chest pain.  Gastrointestinal: Negative for abdominal pain.  Musculoskeletal: Negative for gait problem.  Skin: Positive for rash.  Neurological: Negative for weakness.   Hematological: Negative for adenopathy.     Physical Exam Triage Vital Signs ED Triage Vitals  Enc Vitals Group     BP 06/17/19 1015 129/86     Pulse Rate 06/17/19 1015 (!) 102     Resp 06/17/19 1015 16     Temp 06/17/19 1015 97.9 F (36.6 C)     Temp Source 06/17/19 1015 Other     SpO2 06/17/19 1015 98 %     Weight --      Height --      Head Circumference --      Peak Flow --      Pain Score 06/17/19 1016 2     Pain Loc --      Pain Edu? --      Excl. in GC? --    No data found.  Updated Vital Signs BP 129/86   Pulse (!) 102   Temp 97.9 F (36.6 C) (Other (Comment))   Resp 16   SpO2 98%   Visual Acuity Right Eye Distance:   Left Eye Distance:   Bilateral Distance:    Right Eye Near:   Left Eye Near:    Bilateral Near:     Physical Exam Gen: NAD, alert, cooperative with exam, well-appearing ENT: normal lips, normal nasal mucosa,  Eye: normal EOM, normal conjunctiva and lids CV:  no edema, +2 pedal pulses   Resp: no accessory muscle use, non-labored,  Skin: Dry scaly rash occurring around the neck, no streaking, no areas of induration  Neuro: normal tone, normal sensation to touch Psych:  normal insight, alert and oriented MSK: Normal gait, normal strength   UC Treatments / Results  Labs (all labs ordered are listed, but only abnormal results are displayed) Labs Reviewed - No data to display  EKG   Radiology No results found.  Procedures Procedures (including critical care time)  Medications Ordered in UC Medications - No data to display  Initial Impression / Assessment and Plan / UC Course  I have reviewed the triage vital signs and the nursing notes.  Pertinent labs & imaging results that were available during my care of the patient were reviewed by me and considered in my medical decision making (see chart for details).     Mary Shelton is a 53 year old female is presenting with rash.  Seems consistent with atopic dermatitis.  Has a history  of similar symptoms.  Has currently run out of the triamcinolone cream.  Will provide with prednisone and refill triamcinolone.  Counseled on supportive care.  Given indications to return.  Final Clinical Impressions(s) / UC Diagnoses   Final diagnoses:  Rash     Discharge Instructions     Please try applying an emollient after you bath  Please follow  up if the medication doesn't improve your symptoms.     ED Prescriptions    Medication Sig Dispense Auth. Provider   predniSONE (DELTASONE) 5 MG tablet Take 6 pills for first day, 5 pills second day, 4 pills third day, 3 pills fourth day, 2 pills the fifth day, and 1 pill sixth day. 21 tablet Rosemarie Ax, MD   triamcinolone cream (KENALOG) 0.1 % Apply 1 application topically 2 (two) times daily. 30 g Rosemarie Ax, MD     Controlled Substance Prescriptions  Controlled Substance Registry consulted? Not Applicable   Rosemarie Ax, MD 06/17/19 1049
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
## 2019-06-17 NOTE — Discharge Instructions (Signed)
Please try applying an emollient after you bath  Please follow up if the medication doesn't improve your symptoms.

## 2019-08-13 ENCOUNTER — Telehealth (HOSPITAL_COMMUNITY): Payer: Self-pay | Admitting: Emergency Medicine

## 2019-08-13 ENCOUNTER — Other Ambulatory Visit: Payer: Self-pay

## 2019-08-13 ENCOUNTER — Ambulatory Visit (HOSPITAL_COMMUNITY)
Admission: EM | Admit: 2019-08-13 | Discharge: 2019-08-13 | Disposition: A | Discharge status: Home / Self Care | Payer: Medicaid Other | Attending: Emergency Medicine | Admitting: Emergency Medicine

## 2019-08-13 ENCOUNTER — Encounter (HOSPITAL_COMMUNITY): Payer: Self-pay | Admitting: Emergency Medicine

## 2019-08-13 DIAGNOSIS — J4521 Mild intermittent asthma with (acute) exacerbation: Secondary | ICD-10-CM

## 2019-08-13 DIAGNOSIS — R030 Elevated blood-pressure reading, without diagnosis of hypertension: Secondary | ICD-10-CM

## 2019-08-13 MED ORDER — ALBUTEROL SULFATE HFA 108 (90 BASE) MCG/ACT IN AERS
INHALATION_SPRAY | RESPIRATORY_TRACT | Status: AC
  Filled 2019-08-13: qty 6.7

## 2019-08-13 MED ORDER — ALBUTEROL SULFATE HFA 108 (90 BASE) MCG/ACT IN AERS
2.0000 | INHALATION_SPRAY | RESPIRATORY_TRACT | Status: DC | PRN
  Administered 2019-08-13: 2 via RESPIRATORY_TRACT

## 2019-08-13 MED ORDER — AEROCHAMBER PLUS FLO-VU MEDIUM MISC
1.0000 | Freq: Once | Status: AC
  Administered 2019-08-13: 1

## 2019-08-13 MED ORDER — ALBUTEROL SULFATE (2.5 MG/3ML) 0.083% IN NEBU: 2.5000 mg | INHALATION_SOLUTION | Freq: Four times a day (QID) | RESPIRATORY_TRACT | 0 refills | Status: DC | PRN

## 2019-08-13 MED ORDER — AEROCHAMBER PLUS FLO-VU LARGE MISC
Status: AC
  Filled 2019-08-13: qty 1

## 2019-08-13 MED ORDER — PREDNISONE 10 MG (21) PO TBPK: ORAL_TABLET | Freq: Every day | ORAL | 0 refills | Status: DC

## 2019-08-13 NOTE — ED Provider Notes (Addendum)
[place]    CSN: [id number] Arrival date & time: 08/13/19  1942      History   Chief Complaint Chief Complaint  Patient presents with  . Asthma    HPI Mary Shelton is a 53 y.o. female.   Patient presents with wheezing and shortness of breath x2 hours.  She reports using her albuterol inhaler and nebulizer without relief.  She denies fever, chills, sore throat, cough, vomiting, diarrhea, or other symptoms.  Medical history is significant for asthma.  Patient states she needs a refill on her albuterol nebulizer solution.  The history is provided by the patient.    Past Medical History:  Diagnosis Date  . Anxiety   . Asthma   . Atopic dermatitis   . Depression   . Fibromyalgia   . Headache(784.0)   . Iron deficiency anemia   . Lumbar radiculopathy   . Mitral valve prolapse   . Reactive airway disease with wheezing     Patient Active Problem List   Diagnosis Date Noted  . Asthma 03/01/2012    Past Surgical History:  Procedure Laterality Date  . SPINAL CORD STIMULATOR IMPLANT Left 10/2018    OB History   No obstetric history on file.      Home Medications    Prior to Admission medications   Medication Sig Start Date End Date Taking? Authorizing Provider  albuterol (PROVENTIL HFA;VENTOLIN HFA) 108 (90 Base) MCG/ACT inhaler Inhale 2 puffs into the lungs every 6 (six) hours as needed. 12/14/18   Vanessa Kick, MD  albuterol (PROVENTIL) (2.5 MG/3ML) 0.083% nebulizer solution Take 3 mLs (2.5 mg total) by nebulization every 6 (six) hours as needed for wheezing or shortness of breath. 08/13/19   Sharion Balloon, NP  beclomethasone (QVAR) 80 MCG/ACT inhaler Inhale 2 puffs into the lungs 2 (two) times daily. 12/14/18   Vanessa Kick, MD  brompheniramine-pseudoephedrine-DM 30-2-10 MG/5ML syrup Take 5 mLs by mouth 4 (four) times daily as needed. 01/03/18   Wieters, Hallie C, PA-C  buPROPion (WELLBUTRIN) 75 MG tablet Take 75 mg by mouth daily. 05/23/18 06/17/19   [provider]  diclofenac sodium (VOLTAREN) 1 % GEL Apply 2 g topically 4 (four) times daily. 12/08/18   Wurst, [place], PA-C  famotidine (PEPCID) 20 MG tablet One at bedtime 03/01/12 03/01/13  Tanda Rockers, MD  Fluticasone-Salmeterol (ADVAIR DISKUS IN) Inhale into the lungs.    [provider]  methocarbamol (ROBAXIN) 750 MG tablet Take 1-2 tablets (750-1,500 mg total) by mouth 3 (three) times daily as needed for muscle spasms. 12/01/18   Lorin Glass, PA-C  omeprazole (PRILOSEC) 40 MG capsule Take 1 capsule (40 mg total) by mouth daily. 03/01/12 03/01/13  Tanda Rockers, MD  ondansetron (ZOFRAN ODT) 4 MG disintegrating tablet Take 1 tablet (4 mg total) by mouth every 8 (eight) hours as needed for nausea or vomiting. 01/03/18   Wieters, Hallie C, PA-C  predniSONE (STERAPRED UNI-PAK 21 TAB) 10 MG (21) TBPK tablet Take by mouth daily. Take 6 tabs by mouth daily  for 1 day, then 5 tabs for 1 day, then 4 tabs for 1 day, then 3 tabs for 1 day, 2 tabs for 1 day, then 1 tab by mouth daily for 1 day 08/13/19   Sharion Balloon, NP  traZODone (DESYREL) 50 MG tablet Take 50 mg by mouth at bedtime. 05/23/18   [provider]  triamcinolone cream (KENALOG) 0.1 % Apply 1 application topically 2 (two) times daily. 06/17/19  Myra RudeSchmitz, Jeremy E, MD    Family History Family History  Problem Relation Age of Onset  . Healthy Mother   . Cancer Father     Social History Social History   Tobacco Use  . Smoking status: Never Smoker  . Smokeless tobacco: Never Used  Substance Use Topics  . Alcohol use: No  . Drug use: No     Allergies   Other and Sulfa antibiotics   Review of Systems Review of Systems  Constitutional: Negative for chills and fever.  HENT: Negative for ear pain and sore throat.   Eyes: Negative for pain and visual disturbance.  Respiratory: Positive for shortness of breath and wheezing. Negative for cough.   Cardiovascular: Negative for chest pain and  palpitations.  Gastrointestinal: Negative for abdominal pain and vomiting.  Genitourinary: Negative for dysuria and hematuria.  Musculoskeletal: Negative for arthralgias and back pain.  Skin: Negative for color change and rash.  Neurological: Negative for seizures and syncope.  All other systems reviewed and are negative.    Physical Exam Triage Vital Signs ED Triage Vitals  Enc Vitals Group     BP 08/13/19 1951 (!) 176/92     Pulse Rate 08/13/19 1951 (!) 124     Resp 08/13/19 1951 (!) 24     Temp 08/13/19 1951 97.8 F (36.6 C)     Temp src --      SpO2 08/13/19 1951 100 %     Weight --      Height --      Head Circumference --      Peak Flow --      Pain Score 08/13/19 1952 0     Pain Loc --      Pain Edu? --      Excl. in GC? --    No data found.  Updated Vital Signs BP (!) 176/92   Pulse (!) 124   Temp 97.8 F (36.6 C)   Resp (!) 24   SpO2 100%   Visual Acuity Right Eye Distance:   Left Eye Distance:   Bilateral Distance:    Right Eye Near:   Left Eye Near:    Bilateral Near:     Physical Exam Vitals signs and nursing note reviewed.  Constitutional:      General: She is not in acute distress.    Appearance: She is well-developed. She is not ill-appearing.  HENT:     Head: Normocephalic and atraumatic.     Mouth/Throat:     Mouth: Mucous membranes are moist.     Pharynx: Oropharynx is clear.  Eyes:     Conjunctiva/sclera: Conjunctivae normal.  Neck:     Musculoskeletal: Neck supple.  Cardiovascular:     Rate and Rhythm: Regular rhythm. Tachycardia present.     Heart sounds: No murmur.  Pulmonary:     Effort: Pulmonary effort is normal. No respiratory distress.     Breath sounds: Wheezing present.     Comments: Faint expiratory wheezes throughout. Abdominal:     Palpations: Abdomen is soft.     Tenderness: There is no abdominal tenderness. There is no guarding or rebound.  Skin:    General: Skin is warm and dry.     Findings: No rash.   Neurological:     General: No focal deficit present.     Mental Status: She is alert and oriented to person, place, and time.      UC Treatments / Results  Labs (all labs ordered  are listed, but only abnormal results are displayed) Labs Reviewed - No data to display  EKG   Radiology No results found.  Procedures Procedures (including critical care time)  Medications Ordered in UC Medications  albuterol (VENTOLIN HFA) 108 (90 Base) MCG/ACT inhaler 2 puff (2 puffs Inhalation Given 08/13/19 2005)  AeroChamber Plus Flo-Vu Medium MISC 1 each (1 each Other Given 08/13/19 2005)  albuterol (VENTOLIN HFA) 108 (90 Base) MCG/ACT inhaler (has no administration in time range)  AeroChamber Plus Flo-Vu Large MISC (has no administration in time range)    Initial Impression / Assessment and Plan / UC Course  I have reviewed the triage vital signs and the nursing notes.  Pertinent labs & imaging results that were available during my care of the patient were reviewed by me and considered in my medical decision making (see chart for details).    Asthma exacerbation.  Patient refuses transfer to the ED.  Treated here with albuterol inhaler with spacer.  Refill on albuterol nebulizer solution given.  Also treating with prednisone.  Instructed patient to follow-up with her PCP as scheduled.  Instructed her to go to the emergency department if she has acute shortness of breath or worsening symptoms.  Discussed with patient that her blood pressure is elevated today and needs to be rechecked by her PCP in 2 to 4 weeks.  Patient agrees to plan of care.     Final Clinical Impressions(s) / UC Diagnoses   Final diagnoses:  Mild intermittent asthma with exacerbation     Discharge Instructions     Use your albuterol inhaler or nebulizer as directed.    Take the prednisone as prescribed.    Follow-up with your primary care provider as scheduled.    Go to the emergency department if you have acute  shortness of breath or worsening symptoms.        ED Prescriptions    Medication Sig Dispense Auth. Provider   albuterol (PROVENTIL) (2.5 MG/3ML) 0.083% nebulizer solution Take 3 mLs (2.5 mg total) by nebulization every 6 (six) hours as needed for wheezing or shortness of breath. 75 mL Mickie Bail, NP   predniSONE (STERAPRED UNI-PAK 21 TAB) 10 MG (21) TBPK tablet Take by mouth daily. Take 6 tabs by mouth daily  for 1 day, then 5 tabs for 1 day, then 4 tabs for 1 day, then 3 tabs for 1 day, 2 tabs for 1 day, then 1 tab by mouth daily for 1 day 21 tablet Mickie Bail, NP     PDMP not reviewed this encounter.   Mickie Bail, NP 08/13/19 2018    Mickie Bail, NP 08/13/19 2020

## 2019-08-13 NOTE — ED Triage Notes (Signed)
Pt c/o asthma attack for the last two hours, took her inhalr and nebulizer without relief. Audible wheezing noted.

## 2019-08-13 NOTE — Discharge Instructions (Addendum)
Use your albuterol inhaler or nebulizer as directed.    Take the prednisone as prescribed.    Follow-up with your primary care provider as scheduled.    Go to the emergency department if you have acute shortness of breath or worsening symptoms.    Your blood pressure is elevated today at 176/92.  Please have this rechecked by your primary care provider in 2 weeks.  If you do not have a primary care provider, one is suggested below.

## 2019-08-13 NOTE — Telephone Encounter (Signed)
Pharmacy change

## 2019-08-14 ENCOUNTER — Telehealth (HOSPITAL_COMMUNITY): Payer: Self-pay | Admitting: Emergency Medicine

## 2019-08-14 NOTE — Telephone Encounter (Signed)
Called to check up on patient to see how she was feeling since her visit last night. Pt states she picked up the prednisone and is feeling much better.

## 2019-08-17 ENCOUNTER — Emergency Department (HOSPITAL_COMMUNITY)
Admission: EM | Admit: 2019-08-17 | Discharge: 2019-08-17 | Discharge status: Against Medical Advice | Payer: Medicaid Other

## 2019-08-17 NOTE — ED Notes (Signed)
Called pt name x3 for triage, no response from pt.  
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
## 2020-03-16 ENCOUNTER — Other Ambulatory Visit: Payer: Self-pay

## 2020-03-16 ENCOUNTER — Encounter (HOSPITAL_COMMUNITY): Payer: Self-pay | Admitting: Emergency Medicine

## 2020-03-16 ENCOUNTER — Emergency Department (HOSPITAL_COMMUNITY)
Admission: EM | Admit: 2020-03-16 | Discharge: 2020-03-17 | Disposition: A | Discharge status: Home / Self Care | Payer: Medicaid Other | Attending: Emergency Medicine | Admitting: Emergency Medicine

## 2020-03-16 DIAGNOSIS — R197 Diarrhea, unspecified: Secondary | ICD-10-CM | POA: Diagnosis not present

## 2020-03-16 DIAGNOSIS — R112 Nausea with vomiting, unspecified: Secondary | ICD-10-CM | POA: Insufficient documentation

## 2020-03-16 DIAGNOSIS — J45909 Unspecified asthma, uncomplicated: Secondary | ICD-10-CM | POA: Insufficient documentation

## 2020-03-16 DIAGNOSIS — R1030 Lower abdominal pain, unspecified: Secondary | ICD-10-CM | POA: Insufficient documentation

## 2020-03-16 DIAGNOSIS — Z79899 Other long term (current) drug therapy: Secondary | ICD-10-CM | POA: Diagnosis not present

## 2020-03-16 MED ORDER — SODIUM CHLORIDE 0.9% FLUSH: 3.0000 mL | Freq: Once | INTRAVENOUS | Status: DC

## 2020-03-16 MED ORDER — ONDANSETRON 4 MG PO TBDP
4.0000 mg | ORAL_TABLET | Freq: Once | ORAL | Status: AC | PRN
  Administered 2020-03-16: 4 mg via ORAL
  Filled 2020-03-16: qty 1

## 2020-03-16 NOTE — ED Triage Notes (Signed)
Pt reports diarrhea starting a week ago.  She was given medication at her PCP yesterday however now she is experiencing generalized lower abdominal pain and emesis.

## 2020-03-17 LAB — CBC
HCT: 38.5 % (ref 36.0–46.0)
Hemoglobin: 12.4 g/dL (ref 12.0–15.0)
MCH: 25.2 pg — ABNORMAL LOW (ref 26.0–34.0)
MCHC: 32.2 g/dL (ref 30.0–36.0)
MCV: 78.1 fL — ABNORMAL LOW (ref 80.0–100.0)
Platelets: 289 10*3/uL (ref 150–400)
RBC: 4.93 MIL/uL (ref 3.87–5.11)
RDW: 14.4 % (ref 11.5–15.5)
WBC: 7.4 10*3/uL (ref 4.0–10.5)
nRBC: 0 % (ref 0.0–0.2)

## 2020-03-17 LAB — URINALYSIS, ROUTINE W REFLEX MICROSCOPIC
Bilirubin Urine: NEGATIVE
Glucose, UA: NEGATIVE mg/dL
Ketones, ur: 20 mg/dL — AB
Leukocytes,Ua: NEGATIVE
Nitrite: NEGATIVE
Protein, ur: 30 mg/dL — AB
Specific Gravity, Urine: 1.014 (ref 1.005–1.030)
pH: 6 (ref 5.0–8.0)

## 2020-03-17 LAB — COMPREHENSIVE METABOLIC PANEL
ALT: 18 U/L (ref 0–44)
AST: 19 U/L (ref 15–41)
Albumin: 4.4 g/dL (ref 3.5–5.0)
Alkaline Phosphatase: 92 U/L (ref 38–126)
Anion gap: 12 (ref 5–15)
BUN: 13 mg/dL (ref 6–20)
CO2: 24 mmol/L (ref 22–32)
Calcium: 9.4 mg/dL (ref 8.9–10.3)
Chloride: 104 mmol/L (ref 98–111)
Creatinine, Ser: 0.79 mg/dL (ref 0.44–1.00)
GFR calc Af Amer: >60 mL/min (ref >60)
GFR calc non Af Amer: >60 mL/min (ref >60)
Glucose, Bld: 133 mg/dL — ABNORMAL HIGH (ref 70–99)
Potassium: 3.5 mmol/L (ref 3.5–5.1)
Sodium: 140 mmol/L (ref 135–145)
Total Bilirubin: 0.7 mg/dL (ref 0.3–1.2)
Total Protein: 7.7 g/dL (ref 6.5–8.1)

## 2020-03-17 LAB — I-STAT BETA HCG BLOOD, ED (MC, WL, AP ONLY): I-stat hCG, quantitative: <5 m[IU]/mL (ref <5)

## 2020-03-17 LAB — LIPASE, BLOOD: Lipase: 23 U/L (ref 11–51)

## 2020-03-17 MED ORDER — DICYCLOMINE HCL 20 MG PO TABS
20.0000 mg | ORAL_TABLET | Freq: Two times a day (BID) | ORAL | 0 refills | Status: AC
Start: 2020-03-17 — End: ?

## 2020-03-17 MED ORDER — ONDANSETRON 4 MG PO TBDP
4.0000 mg | ORAL_TABLET | Freq: Three times a day (TID) | ORAL | 0 refills | Status: AC | PRN
Start: 2020-03-17 — End: ?

## 2020-03-17 MED ORDER — METOCLOPRAMIDE HCL 5 MG/ML IJ SOLN
10.0000 mg | Freq: Once | INTRAMUSCULAR | Status: AC
  Administered 2020-03-17: 10 mg via INTRAVENOUS
  Filled 2020-03-17: qty 2

## 2020-03-17 MED ORDER — SODIUM CHLORIDE 0.9 % IV BOLUS
1000.0000 mL | Freq: Once | INTRAVENOUS | Status: AC
  Administered 2020-03-17: 1000 mL via INTRAVENOUS

## 2020-03-17 MED ORDER — DICYCLOMINE HCL 10 MG PO CAPS: 10.0000 mg | ORAL_CAPSULE | Freq: Once | ORAL | Status: DC

## 2020-03-17 MED ORDER — ONDANSETRON HCL 4 MG/2ML IJ SOLN
4.0000 mg | Freq: Once | INTRAMUSCULAR | Status: AC
  Administered 2020-03-17: 4 mg via INTRAVENOUS
  Filled 2020-03-17: qty 2

## 2020-03-17 MED ORDER — KETOROLAC TROMETHAMINE 30 MG/ML IJ SOLN
30.0000 mg | Freq: Once | INTRAMUSCULAR | Status: AC
  Administered 2020-03-17: 30 mg via INTRAVENOUS
  Filled 2020-03-17: qty 1

## 2020-03-17 NOTE — ED Notes (Signed)
Pt called for room however she was sleeping.  RN placed pt's name back in the waiting room.

## 2020-03-17 NOTE — ED Provider Notes (Signed)
[facility] EMERGENCY DEPARTMENT Provider Note   CSN: [id number] Arrival date & time: 03/16/20  2325     History Chief Complaint  Patient presents with  . Diarrhea  . Abdominal Pain    Mary Shelton is a 54 y.o. female.  The history is provided by the patient and medical records.  Diarrhea Associated symptoms: abdominal pain and vomiting   Abdominal Pain Associated symptoms: diarrhea, nausea and vomiting     54 year old female with history of anxiety, asthma, depression, fibromyalgia, irritable bowel disease, presenting to the ED with abdominal pain.  She reports she had diarrhea for about 1 week after eating a takeout sandwich.  She was seen by her primary care doctor and given medication to help stop the diarrhea.  States it has, however now she cannot have a bowel movement.  She reports some diffuse abdominal discomfort, more crampy in nature.  States she came in today due to nausea and vomiting and inability to tolerate further medications.  She is not had any noted fever.  States she is not had any issues like this in quite some time.  She has not followed by GI regularly.  She was given Zofran in triage but reports another episode of emesis after this.  Past Medical History:  Diagnosis Date  . Anxiety   . Asthma   . Atopic dermatitis   . Depression   . Fibromyalgia   . Headache(784.0)   . Iron deficiency anemia   . Lumbar radiculopathy   . Mitral valve prolapse   . Reactive airway disease with wheezing     Patient Active Problem List   Diagnosis Date Noted  . Asthma 03/01/2012    Past Surgical History:  Procedure Laterality Date  . SPINAL CORD STIMULATOR IMPLANT Left 10/2018     OB History   No obstetric history on file.     Family History  Problem Relation Age of Onset  . Healthy Mother   . Cancer Father     Social History   Tobacco Use  . Smoking status: Never Smoker  . Smokeless tobacco: Never Used  Substance Use Topics  .  Alcohol use: No  . Drug use: No    Home Medications Prior to Admission medications   Medication Sig Start Date End Date Taking? Authorizing Provider  albuterol (PROVENTIL HFA;VENTOLIN HFA) 108 (90 Base) MCG/ACT inhaler Inhale 2 puffs into the lungs every 6 (six) hours as needed. 12/14/18   Mardella Layman, MD  albuterol (PROVENTIL) (2.5 MG/3ML) 0.083% nebulizer solution Take 3 mLs (2.5 mg total) by nebulization every 6 (six) hours as needed for wheezing or shortness of breath. 08/13/19   Mickie Bail, NP  beclomethasone (QVAR) 80 MCG/ACT inhaler Inhale 2 puffs into the lungs 2 (two) times daily. 12/14/18   Mardella Layman, MD  brompheniramine-pseudoephedrine-DM 30-2-10 MG/5ML syrup Take 5 mLs by mouth 4 (four) times daily as needed. 01/03/18   Wieters, Hallie C, PA-C  buPROPion (WELLBUTRIN) 75 MG tablet Take 75 mg by mouth daily. 05/23/18 06/17/19  [provider]  diclofenac sodium (VOLTAREN) 1 % GEL Apply 2 g topically 4 (four) times daily. 12/08/18   Wurst, [place], PA-C  famotidine (PEPCID) 20 MG tablet One at bedtime 03/01/12 03/01/13  Nyoka Cowden, MD  Fluticasone-Salmeterol (ADVAIR DISKUS IN) Inhale into the lungs.    [provider]  methocarbamol (ROBAXIN) 750 MG tablet Take 1-2 tablets (750-1,500 mg total) by mouth 3 (three) times daily as needed for muscle spasms. 12/01/18  Lyndel Safe W, PA-C  omeprazole (PRILOSEC) 40 MG capsule Take 1 capsule (40 mg total) by mouth daily. 03/01/12 03/01/13  Nyoka Cowden, MD  ondansetron (ZOFRAN ODT) 4 MG disintegrating tablet Take 1 tablet (4 mg total) by mouth every 8 (eight) hours as needed for nausea or vomiting. 01/03/18   Wieters, Hallie C, PA-C  predniSONE (STERAPRED UNI-PAK 21 TAB) 10 MG (21) TBPK tablet Take by mouth daily. Take 6 tabs by mouth daily  for 1 day, then 5 tabs for 1 day, then 4 tabs for 1 day, then 3 tabs for 1 day, 2 tabs for 1 day, then 1 tab by mouth daily for 1 day 08/13/19   Mickie Bail, NP  traZODone  (DESYREL) 50 MG tablet Take 50 mg by mouth at bedtime. 05/23/18   [provider]  triamcinolone cream (KENALOG) 0.1 % Apply 1 application topically 2 (two) times daily. 06/17/19   Myra Rude, MD    Allergies    Other and Sulfa antibiotics  Review of Systems   Review of Systems  Gastrointestinal: Positive for abdominal pain, diarrhea, nausea and vomiting.  All other systems reviewed and are negative.   Physical Exam Updated Vital Signs BP 133/83 (BP Location: Right Arm)   Pulse (!) 102   Temp 98.6 F (37 C) (Oral)   Resp 16   Ht 5' 5.5" (1.664 m)   Wt 70.8 kg   SpO2 95%   BMI 25.56 kg/m   Physical Exam Vitals and nursing note reviewed.  Constitutional:      Appearance: She is well-developed.  HENT:     Head: Normocephalic and atraumatic.  Eyes:     Conjunctiva/sclera: Conjunctivae normal.     Pupils: Pupils are equal, round, and reactive to light.  Cardiovascular:     Rate and Rhythm: Normal rate and regular rhythm.     Heart sounds: Normal heart sounds.  Pulmonary:     Effort: Pulmonary effort is normal.     Breath sounds: Normal breath sounds.  Abdominal:     General: Bowel sounds are normal.     Palpations: Abdomen is soft.     Tenderness: There is no abdominal tenderness. There is no rebound.     Comments: Soft, nontender, normal bowel sounds, no distention, no peritoneal signs  Musculoskeletal:        General: Normal range of motion.     Cervical back: Normal range of motion.  Skin:    General: Skin is warm and dry.  Neurological:     Mental Status: She is alert and oriented to person, place, and time.     ED Results / Procedures / Treatments   Labs (all labs ordered are listed, but only abnormal results are displayed) Labs Reviewed  COMPREHENSIVE METABOLIC PANEL - Abnormal; Notable for the following components:      Result Value   Glucose, Bld 133 (*)    All other components within normal limits  CBC - Abnormal; Notable for the  following components:   MCV 78.1 (*)    MCH 25.2 (*)    All other components within normal limits  LIPASE, BLOOD  URINALYSIS, ROUTINE W REFLEX MICROSCOPIC  I-STAT BETA HCG BLOOD, ED ([facility], [facility], [facility] ONLY)    EKG None  Radiology No results found.  Procedures Procedures (including critical care time)  Medications Ordered in ED Medications  sodium chloride flush (NS) 0.9 % injection 3 mL (has no administration in time range)  sodium chloride 0.9 %  bolus 1,000 mL (has no administration in time range)  ondansetron (ZOFRAN) injection 4 mg (has no administration in time range)  ketorolac (TORADOL) 30 MG/ML injection 30 mg (has no administration in time range)  ondansetron (ZOFRAN-ODT) disintegrating tablet 4 mg (4 mg Oral Given 03/16/20 2341)    ED Course  I have reviewed the triage vital signs and the nursing notes.  Pertinent labs & imaging results that were available during my care of the patient were reviewed by me and considered in my medical decision making (see chart for details).    MDM Rules/Calculators/A&P  54 y.o. F here with nausea/vomiting.  States she initially had diarrhea for the past week after eating a takeout sandwich, seen by PCP and started on medication to help with diarrhea and has not had a bowel movement since.  Now she reports generalized abdominal cramping along with nausea and vomiting.  She does have history of irritable bowel, has not had any issues in quite a few years.  She is afebrile and nontoxic in appearance here.  Her abdomen is soft and benign.   May possibly have flare up of her IBD. Screening labs obtained from triage and are reassuring.  Patient given IV fluids, Toradol, and Zofran.  Will reassess.    After medications, no further vomiting.  States he still feels a little bit of discomfort and cramping in her lower abdomen.  Will give dose of Bentyl and p.o. challenge.  6:05 AM Shortly after reassessment, patient began vomiting again.  Will give  another liter of fluids and reglan.   Will PO challenge after.    6:45 AM Care signed out to oncoming provider.  Will reassess after additional IVF and medications.  If tolerating oral fluids, feel she is stable for discharge home with symptomatic care and close PCP follow-up.  Final Clinical Impression(s) / ED Diagnoses Final diagnoses:  Non-intractable vomiting with nausea, unspecified vomiting type    Rx / DC Orders ED Discharge Orders    None       Larene Pickett, PA-C 03/17/20 [id number]    Orpah Greek, MD 03/17/20 [phone]

## 2020-03-17 NOTE — Discharge Instructions (Addendum)
Take the prescribed medication as directed.  Push oral fluids, gentle diet for now and progress back to normal as tolerated. Follow-up with your primary care doctor. Return to the ED for new or worsening symptoms.

## 2020-03-17 NOTE — ED Provider Notes (Signed)
54 year old with N/V/D. Diarrhea has improved after Imodium. Work up has been reassuring and abdominal exam benign. Pt has had persistent N/V at shift change therefore another dose of antiemetics ordered. Will reassess.  Pt reports feeling better. Will PO challenge.   Pt tolerated PO. Will d/c.   Bethel Born, PA-C 03/17/20 [id number]    Derwood Kaplan, MD 03/17/20 [phone]

## 2020-03-17 NOTE — ED Notes (Signed)
Taking po fluids c/o her throat being sore

## 2020-03-17 NOTE — ED Notes (Signed)
Patient states she is feeling a little better.

## 2020-07-25 ENCOUNTER — Telehealth: Payer: Medicaid Other | Admitting: Emergency Medicine

## 2020-07-25 DIAGNOSIS — J4531 Mild persistent asthma with (acute) exacerbation: Secondary | ICD-10-CM

## 2020-07-25 MED ORDER — PREDNISONE 20 MG PO TABS
40.0000 mg | ORAL_TABLET | Freq: Every day | ORAL | 0 refills | Status: DC
Start: 2020-07-25 — End: 2020-07-26

## 2020-07-25 MED ORDER — ALBUTEROL SULFATE (2.5 MG/3ML) 0.083% IN NEBU
2.5000 mg | INHALATION_SOLUTION | Freq: Four times a day (QID) | RESPIRATORY_TRACT | 1 refills | Status: DC | PRN
Start: 2020-07-25 — End: 2020-07-26

## 2020-07-25 NOTE — Progress Notes (Signed)
Visit for Asthma  Based on what you have shared with me, it looks like you may have a flare up of your asthma.  Asthma is a chronic (ongoing) lung disease which results in airway obstruction, inflammation and hyper-responsiveness.   Asthma symptoms vary from person to person, with common symptoms including nighttime awakening and decreased ability to participate in normal activities as a result of shortness of breath. It is often triggered by changes in weather, changes in the season, changes in air temperature, or inside (home, school, daycare or work) allergens such as animal dander, mold, mildew, woodstoves or cockroaches.   It can also be triggered by hormonal changes, extreme emotion, physical exertion or an upper respiratory tract illness.     It is important to identify the trigger, and then eliminate or avoid the trigger if possible.   If you have been prescribed medications to be taken on a regular basis, it is important to follow the asthma action plan and to follow guidelines to adjust medication in response to increasing symptoms of decreased peak expiratory flow rate  Treatment: I have prescribed: Albuterol (Proventil) (2.5 mg in 3 mL) 0.083 % Take by nebulization solution every six hours as needed for wheezing or shortness of breath and a prednisone burst 40 mg daily for 5 days.  HOME CARE . Only take medications as instructed by your medical team. . Consider wearing a mask or scarf to improve breathing air temperature have been shown to decrease irritation and decrease exacerbations . Get rest. . Taking a steamy shower or using a humidifier may help nasal congestion sand ease sore throat pain. You can place a towel over your head and breathe in the steam from hot water coming from a faucet. . Using a saline nasal spray works much the same way.  . Cough drops, hare  candies and sore throat lozenges may ease your cough.  . Avoid close contacts especially the very you and the elderly . Cover your mouth if you cough or sneeze . Always remember to wash your hands.    GET HELP RIGHT AWAY IF: . You develop worsening symptoms; breathlessness at rest, drowsy, confused or agitated, unable to speak in full sentences . You have coughing fits . You develop a severe headache or visual changes . You develop shortness of breath, difficulty breathing or start having chest pain . Your symptoms persist after you have completed your treatment plan . If your symptoms do not improve within 10 days  MAKE SURE YOU . Understand these instructions. . Will watch your condition. . Will get help right away if you are not doing well or get worse.   Your e-visit answers were reviewed by a board certified advanced clinical practitioner to complete your personal care plan, Depending upon the condition, your plan could have included both over the counter or prescription medications.  Please review your pharmacy choice. Your safety is important to [place]. If you have drug allergies check your prescription carefully. You can use MyChart to ask questions about today's visit, request a non-urgent call back, or ask for a work or school excuse for 24 hours related to this e-Visit. If it has been greater than 24 hours you will need to follow up with your provider, or enter a new e-Visit to address those concerns.  You will get an e-mail in the next two days asking about your experience. I hope that your e-visit has been valuable and will speed your recovery. Thank you for  using e-visits. **Please do not respond to this message unless you have follow up questions.** Greater than 5 but less than 10 minutes spent researching, coordinating, and implementing care for this patient today

## 2020-07-26 MED ORDER — ALBUTEROL SULFATE HFA 108 (90 BASE) MCG/ACT IN AERS
2.0000 | INHALATION_SPRAY | Freq: Four times a day (QID) | RESPIRATORY_TRACT | 0 refills | Status: DC | PRN
Start: 2020-07-26 — End: 2020-10-15

## 2020-07-26 MED ORDER — PREDNISONE 20 MG PO TABS: 40.0000 mg | ORAL_TABLET | Freq: Every day | ORAL | 0 refills | Status: AC

## 2020-07-26 MED ORDER — ALBUTEROL SULFATE (2.5 MG/3ML) 0.083% IN NEBU: 2.5000 mg | INHALATION_SOLUTION | Freq: Four times a day (QID) | RESPIRATORY_TRACT | 1 refills | Status: DC | PRN

## 2020-07-26 NOTE — Addendum Note (Signed)
Addended by: Liberty Handy on: 07/26/2020 08:15 AM   Modules accepted: Orders

## 2020-10-15 ENCOUNTER — Telehealth: Payer: Medicaid Other | Admitting: Nurse Practitioner

## 2020-10-15 DIAGNOSIS — J4531 Mild persistent asthma with (acute) exacerbation: Secondary | ICD-10-CM

## 2020-10-15 MED ORDER — ALBUTEROL SULFATE (2.5 MG/3ML) 0.083% IN NEBU: 2.5000 mg | INHALATION_SOLUTION | Freq: Four times a day (QID) | RESPIRATORY_TRACT | 0 refills | Status: AC | PRN

## 2020-10-15 MED ORDER — ADVAIR DISKUS 250-50 MCG/DOSE IN AEPB: 1.0000 | INHALATION_SPRAY | Freq: Two times a day (BID) | RESPIRATORY_TRACT | 0 refills | Status: AC

## 2020-10-15 NOTE — Progress Notes (Signed)
E-Visits are not used to request refills.  After reviewing your records, I can verify that you may be running out of a long term medication before your next scheduled appointment.  Based on this information,  I can refill your (free text) on a one time basis.  Please contact your doctor as soon as possible to manage your prescription.  We do not do letters like that in an evisit. You will need to get that from whoever wrote last letter. You really need a PCP to treat your asthma because we cannot continue to refill chronic medications in evisits.    5-10 minutes spent reviewing and documenting in chart.

## 2020-10-21 ENCOUNTER — Telehealth: Payer: Medicaid Other | Admitting: Family

## 2020-10-21 DIAGNOSIS — K0889 Other specified disorders of teeth and supporting structures: Secondary | ICD-10-CM | POA: Diagnosis not present

## 2020-10-21 MED ORDER — CLINDAMYCIN HCL 300 MG PO CAPS: 300.0000 mg | ORAL_CAPSULE | Freq: Four times a day (QID) | ORAL | 0 refills | Status: AC

## 2020-10-21 NOTE — Progress Notes (Signed)
E-Visit for Dental Pain  We are sorry that you are not feeling well.  Here is how we plan to help!  Based on what you have shared with me in the questionnaire, it sounds like you have an abscessed tooth.   Clindamycin 300mg  4 times per day for 7  days.  It is imperative that you see a dentist within 10 days of this eVisit to determine the cause of the dental pain and be sure it is adequately treated  A toothache or tooth pain is caused when the nerve in the root of a tooth or surrounding a tooth is irritated. Dental (tooth) infection, decay, injury, or loss of a tooth are the most common causes of dental pain. Pain may also occur after an extraction (tooth is pulled out). Pain sometimes originates from other areas and radiates to the jaw, thus appearing to be tooth pain.Bacteria growing inside your mouth can contribute to gum disease and dental decay, both of which can cause pain. A toothache occurs from inflammation of the central portion of the tooth called pulp. The pulp contains nerve endings that are very sensitive to pain. Inflammation to the pulp or pulpitis may be caused by dental cavities, trauma, and infection.    HOME CARE:   For toothaches: . Over-the-counter pain medications such as acetaminophen or ibuprofen may be used. Take these as directed on the package while you arrange for a dental appointment. . Avoid very cold or hot foods, because they may make the pain worse. . You may get relief from biting on a cotton ball soaked in oil of cloves. You can get oil of cloves at most drug stores.  For jaw pain: .  Aspirin may be helpful for problems in the joint of the jaw in adults. . If pain happens every time you open your mouth widely, the temporomandibular joint (TMJ) may be the source of the pain. Yawning or taking a large bite of food may worsen the pain. An appointment with your doctor or dentist will help you find the cause.     GET HELP RIGHT AWAY IF:  . You have a high  fever or chills . If you have had a recent head or face injury and develop headache, light headedness, nausea, vomiting, or other symptoms that concern you after an injury to your face or mouth, you could have a more serious injury in addition to your dental injury. . A facial rash associated with a toothache: This condition may improve with medication. Contact your doctor for them to decide what is appropriate. . Any jaw pain occurring with chest pain: Although jaw pain is most commonly caused by dental disease, it is sometimes referred pain from other areas. People with heart disease, especially people who have had stents placed, people with diabetes, or those who have had heart surgery may have jaw pain as a symptom of heart attack or angina. If your jaw or tooth pain is associated with lightheadedness, sweating, or shortness of breath, you should see a doctor as soon as possible. . Trouble swallowing or excessive pain or bleeding from gums: If you have a history of a weakened immune system, diabetes, or steroid use, you may be more susceptible to infections. Infections can often be more severe and extensive or caused by unusual organisms. Dental and gum infections in people with these conditions may require more aggressive treatment. An abscess may need draining or IV antibiotics, for example.  MAKE SURE YOU    Understand  these instructions.  Will watch your condition.  Will get help right away if you are not doing well or get worse.  Thank you for choosing an e-visit. Your e-visit answers were reviewed by a board certified advanced clinical practitioner to complete your personal care plan. Depending upon the condition, your plan could have included both over the counter or prescription medications. Please review your pharmacy choice. Make sure the pharmacy is open so you can pick up prescription now. If there is a problem, you may contact your provider through [employer] and have the  prescription routed to another pharmacy. Your safety is important to [place]. If you have drug allergies check your prescription carefully.  For the next 24 hours you can use MyChart to ask questions about today's visit, request a non-urgent call back, or ask for a work or school excuse. You will get an email in the next two days asking about your experience. I hope that your e-visit has been valuable and will speed your recovery.  Approximately 5 minutes was spent documenting and reviewing patient's chart.
